# Patient Record
Sex: Male | Born: 1980 | Race: Black or African American | Hispanic: No | Marital: Single | Smoking: Never smoker
Health system: Southern US, Community
[De-identification: ages and names within clinical notes are randomized; demographics above are authoritative.]

---

## 2009-03-31 ENCOUNTER — Encounter: Admission: RE | Admit: 2009-03-31 | Discharge: 2009-03-31 | Payer: Self-pay | Admitting: Infectious Diseases

## 2012-11-01 ENCOUNTER — Emergency Department (HOSPITAL_COMMUNITY)
Admission: EM | Admit: 2012-11-01 | Discharge: 2012-11-01 | Disposition: A | Discharge status: Home / Self Care | Payer: No Typology Code available for payment source | Attending: Emergency Medicine | Admitting: Emergency Medicine

## 2012-11-01 ENCOUNTER — Encounter (HOSPITAL_COMMUNITY): Payer: Self-pay | Admitting: Emergency Medicine

## 2012-11-01 DIAGNOSIS — S139XXA Sprain of joints and ligaments of unspecified parts of neck, initial encounter: Secondary | ICD-10-CM | POA: Insufficient documentation

## 2012-11-01 DIAGNOSIS — Y9389 Activity, other specified: Secondary | ICD-10-CM | POA: Insufficient documentation

## 2012-11-01 DIAGNOSIS — S161XXA Strain of muscle, fascia and tendon at neck level, initial encounter: Secondary | ICD-10-CM

## 2012-11-01 DIAGNOSIS — Y92410 Unspecified street and highway as the place of occurrence of the external cause: Secondary | ICD-10-CM | POA: Insufficient documentation

## 2012-11-01 MED ORDER — IBUPROFEN 600 MG PO TABS: 600.0000 mg | ORAL_TABLET | Freq: Four times a day (QID) | ORAL | Status: DC | PRN

## 2012-11-01 MED ORDER — OXYCODONE-ACETAMINOPHEN 5-325 MG PO TABS: 1.0000 | ORAL_TABLET | Freq: Four times a day (QID) | ORAL | Status: DC | PRN

## 2012-11-01 MED ORDER — DIAZEPAM 5 MG PO TABS: 5.0000 mg | ORAL_TABLET | Freq: Four times a day (QID) | ORAL | Status: DC | PRN

## 2012-11-01 MED ORDER — IBUPROFEN 200 MG PO TABS
600.0000 mg | ORAL_TABLET | Freq: Once | ORAL | Status: AC
Start: 2012-11-01 — End: 2012-11-01
  Administered 2012-11-01: 600 mg via ORAL
  Filled 2012-11-01 (×2): qty 1

## 2012-11-01 NOTE — ED Notes (Signed)
MD at bedside; cleared from [facility].
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
## 2013-07-28 ENCOUNTER — Encounter (HOSPITAL_COMMUNITY): Payer: Self-pay | Admitting: Emergency Medicine

## 2013-07-28 DIAGNOSIS — J36 Peritonsillar abscess: Secondary | ICD-10-CM | POA: Insufficient documentation

## 2013-07-28 DIAGNOSIS — R1310 Dysphagia, unspecified: Secondary | ICD-10-CM | POA: Insufficient documentation

## 2013-07-28 DIAGNOSIS — Z79899 Other long term (current) drug therapy: Secondary | ICD-10-CM | POA: Insufficient documentation

## 2013-07-28 DIAGNOSIS — Z792 Long term (current) use of antibiotics: Secondary | ICD-10-CM | POA: Insufficient documentation

## 2013-07-28 LAB — RAPID STREP SCREEN (MED CTR MEBANE ONLY): Streptococcus, Group A Screen (Direct): NEGATIVE

## 2013-07-28 NOTE — ED Notes (Signed)
Pt. reports right sore throat with swelling / hard to swallow onset Sunday , seen at school clinic prescribed with Z-pack antibiotic. Denies fever . Airway intact / respirations unlabored .

## 2013-07-29 ENCOUNTER — Emergency Department (HOSPITAL_COMMUNITY): Payer: BC Managed Care – PPO

## 2013-07-29 ENCOUNTER — Emergency Department (HOSPITAL_COMMUNITY)
Admission: EM | Admit: 2013-07-29 | Discharge: 2013-07-29 | Disposition: A | Discharge status: Home / Self Care | Payer: BC Managed Care – PPO | Attending: Emergency Medicine | Admitting: Emergency Medicine

## 2013-07-29 ENCOUNTER — Encounter (HOSPITAL_COMMUNITY): Payer: Self-pay | Admitting: Radiology

## 2013-07-29 DIAGNOSIS — R1310 Dysphagia, unspecified: Secondary | ICD-10-CM

## 2013-07-29 DIAGNOSIS — J36 Peritonsillar abscess: Secondary | ICD-10-CM

## 2013-07-29 LAB — CBC WITH DIFFERENTIAL/PLATELET
Basophils Absolute: 0 10*3/uL (ref 0.0–0.1)
Basophils Relative: 0 % (ref 0–1)
Eosinophils Absolute: 0.1 10*3/uL (ref 0.0–0.7)
Eosinophils Relative: 1 % (ref 0–5)
HCT: 38.6 % — ABNORMAL LOW (ref 39.0–52.0)
Hemoglobin: 13.8 g/dL (ref 13.0–17.0)
LYMPHS ABS: 1.6 10*3/uL (ref 0.7–4.0)
LYMPHS PCT: 16 % (ref 12–46)
MCH: 29.1 pg (ref 26.0–34.0)
MCHC: 35.8 g/dL (ref 30.0–36.0)
MCV: 81.3 fL (ref 78.0–100.0)
MONO ABS: 0.9 10*3/uL (ref 0.1–1.0)
MONOS PCT: 9 % (ref 3–12)
Neutro Abs: 7.3 10*3/uL (ref 1.7–7.7)
Neutrophils Relative %: 74 % (ref 43–77)
Platelets: 168 10*3/uL (ref 150–400)
RBC: 4.75 MIL/uL (ref 4.22–5.81)
RDW: 12.8 % (ref 11.5–15.5)
WBC: 9.7 10*3/uL (ref 4.0–10.5)

## 2013-07-29 LAB — I-STAT CHEM 8, ED
BUN: 4 mg/dL — ABNORMAL LOW (ref 6–23)
CHLORIDE: 98 meq/L (ref 96–112)
CREATININE: 1 mg/dL (ref 0.50–1.35)
Calcium, Ion: 1.24 mmol/L — ABNORMAL HIGH (ref 1.12–1.23)
GLUCOSE: 102 mg/dL — AB (ref 70–99)
HCT: 42 % (ref 39.0–52.0)
Hemoglobin: 14.3 g/dL (ref 13.0–17.0)
POTASSIUM: 4.7 meq/L (ref 3.7–5.3)
SODIUM: 139 meq/L (ref 137–147)
TCO2: 28 mmol/L (ref 0–100)

## 2013-07-29 LAB — CULTURE, GROUP A STREP

## 2013-07-29 MED ORDER — AMOXICILLIN-POT CLAVULANATE 875-125 MG PO TABS: 1.0000 | ORAL_TABLET | Freq: Two times a day (BID) | ORAL | Status: AC

## 2013-07-29 MED ORDER — DEXAMETHASONE SODIUM PHOSPHATE 10 MG/ML IJ SOLN
10.0000 mg | Freq: Once | INTRAMUSCULAR | Status: AC
  Administered 2013-07-29: 10 mg via INTRAVENOUS
  Filled 2013-07-29: qty 1

## 2013-07-29 MED ORDER — HYDROCODONE-ACETAMINOPHEN 7.5-325 MG/15ML PO SOLN
10.0000 mL | Freq: Once | ORAL | Status: AC
  Administered 2013-07-29: 10 mL via ORAL
  Filled 2013-07-29: qty 15

## 2013-07-29 MED ORDER — SODIUM CHLORIDE 0.9 % IV SOLN
3.0000 g | Freq: Once | INTRAVENOUS | Status: AC
  Administered 2013-07-29: 3 g via INTRAVENOUS
  Filled 2013-07-29: qty 3

## 2013-07-29 MED ORDER — SODIUM CHLORIDE 0.9 % IV BOLUS (SEPSIS)
1000.0000 mL | Freq: Once | INTRAVENOUS | Status: AC
  Administered 2013-07-29: 1000 mL via INTRAVENOUS

## 2013-07-29 MED ORDER — HYDROCODONE-ACETAMINOPHEN 7.5-325 MG/15ML PO SOLN: 15.0000 mL | Freq: Three times a day (TID) | ORAL | Status: AC | PRN

## 2013-07-29 MED ORDER — IOHEXOL 300 MG/ML  SOLN
75.0000 mL | Freq: Once | INTRAMUSCULAR | Status: AC | PRN
  Administered 2013-07-29: 75 mL via INTRAVENOUS

## 2013-07-29 NOTE — Discharge Instructions (Signed)
Peritonsillar Abscess °A peritonsillar abscess is a collection of pus located in the back of the throat behind the tonsils. It usually occurs when a streptococcal infection of the throat or tonsils spreads into the space around the tonsils. They are almost always caused by the streptococcal germ (bacteria). The treatment of a peritonsillar abscess is most often drainage accomplished by putting a needle into the abscess or cutting (incising) and draining the abscess. This is most often followed with a course of antibiotics. °HOME CARE INSTRUCTIONS °· If your abscess was drained by your caregiver today, rinse your throat (gargle) with warm salt water four times per day or as needed for comfort. Do not swallow this mixture. Mix 1 teaspoon of salt in 8 ounces of warm water for gargling. °· Rest in bed as needed. Resume activities as able. °· Apply cold to your neck for pain relief. Fill a plastic bag with ice and wrap it in a towel. Hold the ice on your neck for 20 minutes 4 times per day. °· Eat a soft or liquid diet as tolerated while your throat remains sore. Popsicles and ice cream may be good early choices. Drinking plenty of cold fluids will probably be soothing and help take swelling down in between the warm gargles. °· Only take over-the-counter or prescription medicines for pain, discomfort, or fever as directed by your caregiver. Do not use aspirin unless directed by your physician. Aspirin slows down the clotting process. It can also cause bleeding from the drainage area if this was needled or incised today. °· If antibiotics were prescribed, take them as directed for the full course of the prescription. Even if you feel you are well, you need to take them. °SEEK MEDICAL CARE IF:  °· You have increased pain, swelling, redness, or drainage in your throat. °· You develop signs of infection such as dizziness, headache, lethargy, or generalized feelings of illness. °· You have difficulty breathing, swallowing or  eating. °· You show signs of becoming dehydrated (lightheadedness when standing, decreased urine output, a fast heart rate, or dry mouth and mucous membranes). °SEEK IMMEDIATE MEDICAL CARE IF:  °· You have a fever. °· You are coughing up or vomiting blood. °· You develop more severe throat pain uncontrolled with medicines or you start to drool. °· You develop difficulty breathing, talking, or find it easier to breathe while leaning forward. °Document Released: 01/21/2005 Document Revised: 04/15/2011 Document Reviewed: 09/04/2007 °ExitCare® Patient Information ©2015 [employer]. This information is not intended to replace advice given to you by your health care provider. Make sure you discuss any questions you have with your health care provider. ° °

## 2013-07-29 NOTE — ED Provider Notes (Signed)
CSN: [id number]     Arrival date & time 07/28/13  2248 History   First MD Initiated Contact with Patient 07/29/13 0201     Chief Complaint  Patient presents with  . Sore Throat    (Consider location/radiation/quality/duration/timing/severity/associated sxs/prior Treatment) HPI Comments: Patient is a 33 year old male with no significant past medical history who presents to the emergency department for right-sided sore throat x4 days. Patient states the symptoms have been gradually worsening since onset. Pain is worse with swallowing, the patient has been able to tolerate secretions. He states symptoms have been associated with anterior neck soreness as well as voice changes and difficulty speaking as a result of pain and swelling. Patient was seen at his school clinic 2 days ago and prescribed a Z-Pak. Patient states he has been able to tolerate this antibiotic, but has not been experiencing any relief. He is also been using Tussin DM for sore throat without improvement. Patient denies associated fever, drooling, shortness of breath, vomiting, neck stiffness, oral lesions, or oral bleeding.  Patient is a 33 y.o. male presenting with pharyngitis. The history is provided by the patient and a friend. No language interpreter was used.  Sore Throat Associated symptoms include a sore throat. Pertinent negatives include no fever or vomiting.    History reviewed. No pertinent past medical history. History reviewed. No pertinent past surgical history. No family history on file. History  Substance Use Topics  . Smoking status: Never Smoker   . Smokeless tobacco: Not on file  . Alcohol Use: No    Review of Systems  Constitutional: Positive for appetite change (secondary to sore throat.). Negative for fever.  HENT: Positive for sore throat and trouble swallowing. Negative for drooling and facial swelling.   Respiratory: Negative for shortness of breath.   Gastrointestinal: Negative for vomiting.   All other systems reviewed and are negative.    Allergies  Review of patient's allergies indicates no known allergies.  Home Medications   Prior to Admission medications   Medication Sig Start Date End Date Taking? Authorizing Koben Daman  azithromycin (ZITHROMAX) 250 MG tablet Take 250 mg by mouth daily. 07/27/13 07/31/13 Yes Historical Natina Wiginton, MD  dextromethorphan-guaiFENesin (TUSSIN DM) 10-100 MG/5ML liquid Take 10 mLs by mouth 2 (two) times daily.   Yes Historical Christopher Hink, MD  ibuprofen (ADVIL,MOTRIN) 200 MG tablet Take 200 mg by mouth every 6 (six) hours as needed.   Yes Historical Jonique Kulig, MD  Multiple Vitamins-Minerals (AIRBORNE PO) Take 1 tablet by mouth daily.   Yes Historical Tynslee Bowlds, MD   BP 123/72  Pulse 81  Temp(Src) 99.1 F (37.3 C) (Oral)  Resp 18  Wt 126 lb 3.2 oz (57.244 kg)  SpO2 99%  Physical Exam  Nursing note and vitals reviewed. Constitutional: He is oriented to person, place, and time. He appears well-developed and well-nourished. No distress.  Nontoxic/nonseptic appearing  HENT:  Head: Normocephalic and atraumatic.  Right Ear: No mastoid tenderness.  Left Ear: No mastoid tenderness.  Mouth/Throat: Mucous membranes are normal. No oral lesions. There is trismus (mild) in the jaw. Uvula swelling present. Posterior oropharyngeal edema and posterior oropharyngeal erythema present. No oropharyngeal exudate.  L tonsil 3+ and R tonsil 4+ without exudates. Difficult to assess uvula deviation secondary to tonsillar edema. +posterior oropharyngeal erythema. Patient tolerating secretions, but causes significant discomfort. Voice muffled.  Eyes: Conjunctivae and EOM are normal. No scleral icterus.  Neck: Normal range of motion.  No stridor No nuchal rigidity or meningismus.  Cardiovascular: Normal rate, regular rhythm  and normal heart sounds.   Pulmonary/Chest: Effort normal. No stridor. No respiratory distress. He has no wheezes. He has no rales.  No tachypnea,  dyspnea, or accessory muscle use. No tripoding.  Musculoskeletal: Normal range of motion.  Lymphadenopathy:    He has cervical adenopathy.       Right cervical: Superficial cervical adenopathy present.       Left cervical: Superficial cervical adenopathy present.  Neurological: He is alert and oriented to person, place, and time.  GCS 15. Patient moves extremities without ataxia.  Skin: Skin is warm and dry. No rash noted. He is not diaphoretic. No erythema. No pallor.  Psychiatric: He has a normal mood and affect. His behavior is normal.    ED Course  Procedures (including critical care time) Labs Review Labs Reviewed  CBC WITH DIFFERENTIAL - Abnormal; Notable for the following:    HCT 38.6 (*)    All other components within normal limits  I-STAT CHEM 8, ED - Abnormal; Notable for the following:    BUN 4 (*)    Glucose, Bld 102 (*)    Calcium, Ion 1.24 (*)    All other components within normal limits  RAPID STREP SCREEN  CULTURE, GROUP A STREP    Imaging Review Ct Soft Tissue Neck W Contrast  07/29/2013   CLINICAL DATA:  SORE THROAT  EXAM: CT NECK WITH CONTRAST  TECHNIQUE: Multidetector CT imaging of the neck was performed using the standard protocol following the bolus administration of intravenous contrast.  CONTRAST:  75mL OMNIPAQUE IOHEXOL 300 MG/ML  SOLN  COMPARISON:  None.  FINDINGS: Visualized portions of the brain and posterior fossa are unremarkable.  Moderate mucoperiosteal thickening present within the partially visualized right maxillary sinus. Air-fluid levels are present within the left maxillary and sphenoid sinuses. No mastoid effusion.  The salivary glands including the parotid glands and submandibular glands are within normal limits.  Oral cavity within normal limits without loculated fluid collection or mass lesion.  An ill-defined hypodense collection measuring approximately 1.1 x 1.8 x 2.1 cm seen within the right palatine tonsil (series 3, image 27). Central  calcification within this collection likely represents a tonsillith. Finding is compatible with tonsillitis with intra tonsillar abscess. There is mild deviation of the uvula to the left. Heterogeneous fluid density seen about the uvula and layering posteriorly within the hypopharynx (series 3, image 48). Tiny retropharyngeal effusion noted without retropharyngeal abscess.  Mildly prominent right level 2 lymph node measuring 1.1 cm in short axis noted, likely reactive in nature.  Epiglottis is normal.  Vallecula is clear.  True vocal cords are symmetric and normal in appearance. Subglottic airway is clear.  Thyroid gland is unremarkable.  Visualized portions of the superior mediastinum are within normal limits.  The visualized lung apices are clear.  Normal intravascular enhancement seen throughout the neck.  No acute osseous abnormality. Mild reversal of the normal cervical lordosis noted. No worrisome lytic or blastic osseous lesions.  IMPRESSION: 1. Loculated hypodense collection within the right palatine tonsil, consistent with intra tonsillar abscess. 2. Heterogeneous fluid density about the uvula and layering within the posterior hypopharynx, most compatible with retained secretions/mucus. No retropharyngeal abscess. 3. Mildly prominent 1.1 cm right level 2 lymph node, likely reactive in nature.   Electronically Signed   By: Rise MuBenjamin  McClintock M.D.   On: 07/29/2013 04:46     EKG Interpretation None      MDM   Final diagnoses:  Abscess, intratonsillar  Dysphagia    0245 -  33 year old male presents to the emergency department for right-sided sore throat x4 days. Symptoms associated with muffled voice and dysphasia. Patient denies fever and drooling, though swallowing has been very painful. Patient denies shortness of breath. Symptoms not resolved by azithromycin started 2 days ago after being seen at school clinic. Physical exam as above. Patient protecting airway. Will w/u with CT soft tissue  neck, CBC, and BMP.  0515 - CT reviewed which shows R intratonsillar abscess with mild uvula deviation. No retropharyngeal abscess. IV Unasyn ordered. Will consult ENT to discuss drainage.  [id number] - Spoke with Dr. Jearld FentonByers of ENT who recommends drainage in office. Requests patient call office at 8AM to schedule time for drainage today. Will plan to d/c with referral upon completion of abx. Will also prescribe Hycet for pain and Augmentin to continue unless counseled to d/c by Dr. Jearld FentonByers.   Filed Vitals:   07/29/13 0245 07/29/13 0300 07/29/13 0515 07/29/13 0530  BP:   106/65 110/69  Pulse: 85 81 79 75  Temp:      TempSrc:      Resp:   19 19  Weight:      SpO2: 98% 99% 95% 94%      Antony MaduraKelly Humes, PA-C 07/29/13 [phone]

## 2013-07-30 ENCOUNTER — Telehealth (HOSPITAL_BASED_OUTPATIENT_CLINIC_OR_DEPARTMENT_OTHER): Payer: Self-pay | Admitting: Emergency Medicine

## 2013-07-30 NOTE — Telephone Encounter (Signed)
Post ED Visit - Positive Culture Follow-up  Culture report reviewed by antimicrobial stewardship pharmacist: []  Wes Dulaney, Pharm.D., BCPS []  Celedonio MiyamotoJeremy Frens, Pharm.D., BCPS []  Georgina PillionElizabeth Martin, Pharm.D., BCPS [x]  SunbrookMinh Pham, [address] Rainbow BoulevardPharm.D., BCPS, AAHIVP []  Estella HuskMichelle Turner, Pharm.D., BCPS, AAHIVP []  Harvie JuniorNathan Cope, Pharm.D.  Positive Group A strep culture Treated with Augmentin and Azithromycin, organism sensitive to the same and no further patient follow-up is required at this time.  Jiles HaroldGammons, Shannon Chaney 07/30/2013, 12:35 PM

## 2013-08-01 NOTE — ED Provider Notes (Signed)
Medical screening examination/treatment/procedure(s) were performed by non-physician practitioner and as supervising physician I was immediately available for consultation/collaboration.   Merrie RoofJohn David Wofford III, MD 08/01/13 Rickey [id number]

## 2014-06-22 ENCOUNTER — Emergency Department (HOSPITAL_COMMUNITY): Payer: BLUE CROSS/BLUE SHIELD

## 2014-06-22 ENCOUNTER — Emergency Department (HOSPITAL_COMMUNITY)
Admission: EM | Admit: 2014-06-22 | Discharge: 2014-06-22 | Disposition: A | Discharge status: Home / Self Care | Payer: BLUE CROSS/BLUE SHIELD | Attending: Emergency Medicine | Admitting: Emergency Medicine

## 2014-06-22 ENCOUNTER — Encounter (HOSPITAL_COMMUNITY): Payer: Self-pay | Admitting: Emergency Medicine

## 2014-06-22 DIAGNOSIS — Z79899 Other long term (current) drug therapy: Secondary | ICD-10-CM | POA: Diagnosis not present

## 2014-06-22 DIAGNOSIS — M549 Dorsalgia, unspecified: Secondary | ICD-10-CM | POA: Diagnosis not present

## 2014-06-22 DIAGNOSIS — R079 Chest pain, unspecified: Secondary | ICD-10-CM

## 2014-06-22 DIAGNOSIS — R091 Pleurisy: Secondary | ICD-10-CM | POA: Diagnosis not present

## 2014-06-22 LAB — BASIC METABOLIC PANEL
Anion gap: 7 (ref 5–15)
BUN: 8 mg/dL (ref 6–20)
CALCIUM: 9.1 mg/dL (ref 8.9–10.3)
CO2: 27 mmol/L (ref 22–32)
CREATININE: 1.05 mg/dL (ref 0.61–1.24)
Chloride: 103 mmol/L (ref 101–111)
GFR calc Af Amer: >60 mL/min (ref >60)
GFR calc non Af Amer: >60 mL/min (ref >60)
Glucose, Bld: 110 mg/dL — ABNORMAL HIGH (ref 65–99)
POTASSIUM: 4.2 mmol/L (ref 3.5–5.1)
Sodium: 137 mmol/L (ref 135–145)

## 2014-06-22 LAB — CBC
HCT: 39.1 % (ref 39.0–52.0)
Hemoglobin: 13.9 g/dL (ref 13.0–17.0)
MCH: 28.8 pg (ref 26.0–34.0)
MCHC: 35.5 g/dL (ref 30.0–36.0)
MCV: 81.1 fL (ref 78.0–100.0)
PLATELETS: 159 10*3/uL (ref 150–400)
RBC: 4.82 MIL/uL (ref 4.22–5.81)
RDW: 12.8 % (ref 11.5–15.5)
WBC: 6.4 10*3/uL (ref 4.0–10.5)

## 2014-06-22 LAB — I-STAT TROPONIN, ED: TROPONIN I, POC: 0 ng/mL (ref 0.00–0.08)

## 2014-06-22 LAB — BRAIN NATRIURETIC PEPTIDE: B NATRIURETIC PEPTIDE 5: 1.9 pg/mL (ref 0.0–100.0)

## 2014-06-22 MED ORDER — TRAMADOL HCL 50 MG PO TABS: 50.0000 mg | ORAL_TABLET | Freq: Four times a day (QID) | ORAL | Status: AC | PRN

## 2014-06-22 MED ORDER — DEXAMETHASONE 4 MG PO TABS
12.0000 mg | ORAL_TABLET | Freq: Once | ORAL | Status: AC
  Administered 2014-06-22: 12 mg via ORAL
  Filled 2014-06-22: qty 3

## 2014-06-22 MED ORDER — KETOROLAC TROMETHAMINE 30 MG/ML IJ SOLN
30.0000 mg | Freq: Once | INTRAMUSCULAR | Status: AC
  Administered 2014-06-22: 30 mg via INTRAVENOUS
  Filled 2014-06-22: qty 1

## 2014-06-22 NOTE — ED Notes (Signed)
Pt. reports central chest pain with mild SOB onset yesterday , denies emesis or diaphoresis .

## 2014-06-22 NOTE — ED Provider Notes (Signed)
CSN: [id number]     Arrival date & time 06/22/14  0051 History  This chart was scribed for Dione Boozeavid Ramonia Mcclaran, MD by Abel PrestoKara Demonbreun, ED Scribe. This patient was seen in room D30C/D30C and the patient's care was started at 1:42 AM.    No chief complaint on file.    The history is provided by the patient. No language interpreter was used.   HPI Comments: Echo Peterson LombardBoamah Ta is a 34 y.o. male who presents to the Emergency Department complaining of 8/10 mid chest pain with onset 2 days ago. He states he was sitting at onset. Pt notes pain radiates to back. Pt states bending down, drinking, and laughing aggravates the pain. He denies any alleviating factors. Pt took Advil with no relief. Pt is not a smoker. He denies EtOH and drug use. He denies recent prolonged travel and cardiac FHx. Pt denies cough, SOB, nausea, and diaphoresis.  Pt does not have a PCP.  History reviewed. No pertinent past medical history. History reviewed. No pertinent past surgical history. No family history on file. History  Substance Use Topics  . Smoking status: Never Smoker   . Smokeless tobacco: Not on file  . Alcohol Use: No    Review of Systems  Constitutional: Negative for diaphoresis.  Respiratory: Negative for cough and shortness of breath.   Cardiovascular: Positive for chest pain.  Gastrointestinal: Negative for nausea.  Musculoskeletal: Positive for back pain.  All other systems reviewed and are negative.     Allergies  Review of patient's allergies indicates no known allergies.  Home Medications   Prior to Admission medications   Medication Sig Start Date End Date Taking? Authorizing Provider  amoxicillin-clavulanate (AUGMENTIN) 875-125 MG per tablet Take 1 tablet by mouth every 12 (twelve) hours. 07/29/13   Antony MaduraKelly Humes, PA-C  dextromethorphan-guaiFENesin (TUSSIN DM) 10-100 MG/5ML liquid Take 10 mLs by mouth 2 (two) times daily.    Historical Provider, MD  HYDROcodone-acetaminophen (HYCET) 7.5-325 mg/15  ml solution Take 15 mLs by mouth every 8 (eight) hours as needed for moderate pain. 07/29/13   Antony MaduraKelly Humes, PA-C  ibuprofen (ADVIL,MOTRIN) 200 MG tablet Take 200 mg by mouth every 6 (six) hours as needed.    Historical Provider, MD  Multiple Vitamins-Minerals (AIRBORNE PO) Take 1 tablet by mouth daily.    Historical Provider, MD   BP 125/82 mmHg  Pulse 84  Temp(Src) 98.3 F (36.8 C) (Oral)  Resp 16  SpO2 98% Physical Exam  Constitutional: He is oriented to person, place, and time. He appears well-developed and well-nourished.  HENT:  Head: Normocephalic.  Eyes: Conjunctivae are normal. Pupils are equal, round, and reactive to light.  Neck: Normal range of motion. Neck supple. No JVD present.  Cardiovascular: Normal rate, regular rhythm and normal heart sounds.   No murmur heard. Pulmonary/Chest: Effort normal and breath sounds normal. He has no wheezes. He has no rales. He exhibits no tenderness.  Abdominal: Soft. Bowel sounds are normal. He exhibits no distension and no mass. There is no tenderness.  Musculoskeletal: Normal range of motion. He exhibits no edema.  Lymphadenopathy:    He has no cervical adenopathy.  Neurological: He is alert and oriented to person, place, and time. No cranial nerve deficit. He exhibits normal muscle tone. Coordination normal.  Skin: Skin is warm and dry. No rash noted.  Psychiatric: He has a normal mood and affect. His behavior is normal. Judgment and thought content normal.  Nursing note and vitals reviewed.   ED Course  Procedures (including critical care time) DIAGNOSTIC STUDIES: Oxygen Saturation is 98% on room air, normal by my interpretation.    COORDINATION OF CARE: 1:46 AM Discussed treatment plan with patient at beside, the patient agrees with the plan and has no further questions at this time.   Labs Review Results for orders placed or performed during the hospital encounter of 06/22/14  CBC  Result Value Ref Range   WBC 6.4 4.0 -  10.5 K/uL   RBC 4.82 4.22 - 5.81 MIL/uL   Hemoglobin 13.9 13.0 - 17.0 g/dL   HCT 16.139.1 09.639.0 - 04.552.0 %   MCV 81.1 78.0 - 100.0 fL   MCH 28.8 26.0 - 34.0 pg   MCHC 35.5 30.0 - 36.0 g/dL   RDW 40.912.8 81.111.5 - 91.415.5 %   Platelets 159 150 - 400 K/uL  Basic metabolic panel  Result Value Ref Range   Sodium 137 135 - 145 mmol/L   Potassium 4.2 3.5 - 5.1 mmol/L   Chloride 103 101 - 111 mmol/L   CO2 27 22 - 32 mmol/L   Glucose, Bld 110 (H) 65 - 99 mg/dL   BUN 8 6 - 20 mg/dL   Creatinine, Ser 7.821.05 0.61 - 1.24 mg/dL   Calcium 9.1 8.9 - 95.610.3 mg/dL   GFR calc non Af Amer >60 >60 mL/min   GFR calc Af Amer >60 >60 mL/min   Anion gap 7 5 - 15  BNP (order ONLY if patient complains of dyspnea/SOB AND you have documented it for THIS visit)  Result Value Ref Range   B Natriuretic Peptide 1.9 0.0 - 100.0 pg/mL  I-stat troponin, ED  ([facility])  Result Value Ref Range   Troponin i, poc 0.00 0.00 - 0.08 ng/mL   Comment 3           Dg Chest 2 View  06/22/2014   CLINICAL DATA:  Mid chest pain since yesterday.  EXAM: CHEST  2 VIEW  COMPARISON:  03/31/2009  FINDINGS: The cardiomediastinal contours are normal. The lungs are clear. Pulmonary vasculature is normal. No consolidation, pleural effusion, or pneumothorax. No acute osseous abnormalities are seen.  IMPRESSION: No acute pulmonary process.   Electronically Signed   By: Rubye OaksMelanie  Ehinger M.D.   On: 06/22/2014 01:47     Imaging Review Dg Chest 2 View  06/22/2014   CLINICAL DATA:  Mid chest pain since yesterday.  EXAM: CHEST  2 VIEW  COMPARISON:  03/31/2009  FINDINGS: The cardiomediastinal contours are normal. The lungs are clear. Pulmonary vasculature is normal. No consolidation, pleural effusion, or pneumothorax. No acute osseous abnormalities are seen.  IMPRESSION: No acute pulmonary process.   Electronically Signed   By: Rubye OaksMelanie  Ehinger M.D.   On: 06/22/2014 01:47     EKG Interpretation   Date/Time:  Wednesday Jun 22 2014 01:04:32  EDT Ventricular Rate:  80 PR Interval:  130 QRS Duration: 86 QT Interval:  332 QTC Calculation: 382 R Axis:   9 Text Interpretation:  Normal sinus rhythm Normal ECG Confirmed by Lincoln Brighamees,  Liz [phone]([id number]) on 06/22/2014 1:08:02 AM      MDM   Final diagnoses:  Pleurisy    Pleuritic chest pain. He is sent for chest x-ray which is unremarkable. He is given a dose of ketorolac with no relief of pain. He is discharged with definitive diagnosis of pleurisy, probably of viral origin. He is advised to use ibuprofen for pain and is given a prescription for tramadol for pain not relieved  with ibuprofen.   I personally performed the services described in this documentation, which was scribed in my presence. The recorded information has been reviewed and is accurate.       Dione Booze, MD 06/22/14 [phone]

## 2014-06-22 NOTE — Discharge Instructions (Signed)
Take ibuprofen for less severe pain.  Pleurisy Pleurisy is an inflammation and swelling of the lining of the lungs (pleura). Because of this inflammation, it hurts to breathe. It can be aggravated by coughing, laughing, or deep breathing. Pleurisy is often caused by an underlying infection or disease.  HOME CARE INSTRUCTIONS  Monitor your pleurisy for any changes. The following actions may help to alleviate any discomfort you are experiencing:  Medicine may help with pain. Only take over-the-counter or prescription medicines for pain, discomfort, or fever as directed by your health care provider.  Only take antibiotic medicine as directed. Make sure to finish it even if you start to feel better. SEEK MEDICAL CARE IF:   Your pain is not controlled with medicine or is increasing.  You have an increase in pus-like (purulent) secretions brought up with coughing. SEEK IMMEDIATE MEDICAL CARE IF:   You have blue or dark lips, fingernails, or toenails.  You are coughing up blood.  You have increased difficulty breathing.  You have continuing pain unrelieved by medicine or pain lasting more than 1 week.  You have pain that radiates into your neck, arms, or jaw.  You develop increased shortness of breath or wheezing.  You develop a fever, rash, vomiting, fainting, or other serious symptoms. MAKE SURE YOU:  Understand these instructions.   Will watch your condition.   Will get help right away if you are not doing well or get worse.  Document Released: 01/21/2005 Document Revised: 09/23/2012 Document Reviewed: 07/05/2012 [facility] Patient Information 2015 [place], [place]. This information is not intended to replace advice given to you by your health care provider. Make sure you discuss any questions you have with your health care provider.  Tramadol tablets What is this medicine? TRAMADOL (TRA ma dole) is a pain reliever. It is used to treat moderate to severe pain in adults. This  medicine may be used for other purposes; ask your health care provider or pharmacist if you have questions. COMMON BRAND NAME(S): Ultram What should I tell my health care provider before I take this medicine? They need to know if you have any of these conditions: -brain tumor -depression -drug abuse or addiction -head injury -if you frequently drink alcohol containing drinks -kidney disease or trouble passing urine -liver disease -lung disease, asthma, or breathing problems -seizures or epilepsy -suicidal thoughts, plans, or attempt; a previous suicide attempt by you or a family member -an unusual or allergic reaction to tramadol, codeine, other medicines, foods, dyes, or preservatives -pregnant or trying to get pregnant -breast-feeding How should I use this medicine? Take this medicine by mouth with a full glass of water. Follow the directions on the prescription label. If the medicine upsets your stomach, take it with food or milk. Do not take more medicine than you are told to take. Talk to your pediatrician regarding the use of this medicine in children. Special care may be needed. Overdosage: If you think you have taken too much of this medicine contact a poison control center or emergency room at once. NOTE: This medicine is only for you. Do not share this medicine with others. What if I miss a dose? If you miss a dose, take it as soon as you can. If it is almost time for your next dose, take only that dose. Do not take double or extra doses. What may interact with this medicine? Do not take this medicine with any of the following medications: -MAOIs like Carbex, Eldepryl, Marplan, Nardil, and Parnate  This medicine may also interact with the following medications: -alcohol or medicines that contain alcohol -antihistamines -benzodiazepines -bupropion -carbamazepine or oxcarbazepine -clozapine -cyclobenzaprine -digoxin -furazolidone -linezolid -medicines for depression,  anxiety, or psychotic disturbances -medicines for migraine headache like almotriptan, eletriptan, frovatriptan, naratriptan, rizatriptan, sumatriptan, zolmitriptan -medicines for pain like pentazocine, buprenorphine, butorphanol, meperidine, nalbuphine, and propoxyphene -medicines for sleep -muscle relaxants -naltrexone -phenobarbital -phenothiazines like perphenazine, thioridazine, chlorpromazine, mesoridazine, fluphenazine, prochlorperazine, promazine, and trifluoperazine -procarbazine -warfarin This list may not describe all possible interactions. Give your health care provider a list of all the medicines, herbs, non-prescription drugs, or dietary supplements you use. Also tell them if you smoke, drink alcohol, or use illegal drugs. Some items may interact with your medicine. What should I watch for while using this medicine? Tell your doctor or health care professional if your pain does not go away, if it gets worse, or if you have new or a different type of pain. You may develop tolerance to the medicine. Tolerance means that you will need a higher dose of the medicine for pain relief. Tolerance is normal and is expected if you take this medicine for a long time. Do not suddenly stop taking your medicine because you may develop a severe reaction. Your body becomes used to the medicine. This does NOT mean you are addicted. Addiction is a behavior related to getting and using a drug for a non-medical reason. If you have pain, you have a medical reason to take pain medicine. Your doctor will tell you how much medicine to take. If your doctor wants you to stop the medicine, the dose will be slowly lowered over time to avoid any side effects. You may get drowsy or dizzy. Do not drive, use machinery, or do anything that needs mental alertness until you know how this medicine affects you. Do not stand or sit up quickly, especially if you are an older patient. This reduces the risk of dizzy or fainting  spells. Alcohol can increase or decrease the effects of this medicine. Avoid alcoholic drinks. You may have constipation. Try to have a bowel movement at least every 2 to 3 days. If you do not have a bowel movement for 3 days, call your doctor or health care professional. Your mouth may get dry. Chewing sugarless gum or sucking hard candy, and drinking plenty of water may help. Contact your doctor if the problem does not go away or is severe. What side effects may I notice from receiving this medicine? Side effects that you should report to your doctor or health care professional as soon as possible: -allergic reactions like skin rash, itching or hives, swelling of the face, lips, or tongue -breathing difficulties, wheezing -confusion -itching -light headedness or fainting spells -redness, blistering, peeling or loosening of the skin, including inside the mouth -seizures Side effects that usually do not require medical attention (report to your doctor or health care professional if they continue or are bothersome): -constipation -dizziness -drowsiness -headache -nausea, vomiting This list may not describe all possible side effects. Call your doctor for medical advice about side effects. You may report side effects to FDA at 1-800-FDA-1088. Where should I keep my medicine? Keep out of the reach of children. Store at room temperature between 15 and 30 degrees C (59 and 86 degrees F). Keep container tightly closed. Throw away any unused medicine after the expiration date. NOTE: This sheet is a summary. It may not cover all possible information. If you have questions about this medicine, talk to  your doctor, pharmacist, or health care provider.  2015, [employer]. (2009-10-04 11:55:44)

## 2015-06-15 IMAGING — CT CT NECK W/ CM
3 of 5 series · 12 of 33 positions shown, 14 images · IV contrast (Omni 300)
Comparison: None.

CLINICAL DATA: SORE THROAT

EXAM:
CT NECK WITH CONTRAST
TECHNIQUE: Multidetector CT imaging of the neck was performed using the
standard protocol following the bolus administration of intravenous
contrast.
CONTRAST:  75mL OMNIPAQUE IOHEXOL 300 MG/ML  SOLN

[Series 3: neck 2.0 i31s 3 · axial · 0.46mm/px · z∈[-236,-104]mm · 4 of 112 slices shown, 5 images]
[im 23/112  soft-tissue]
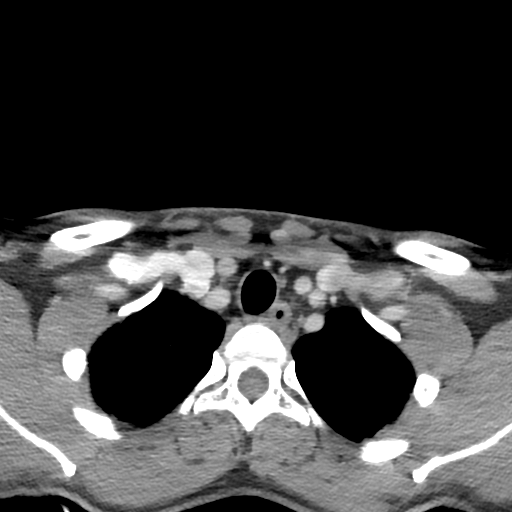
[im 23/112  bone]
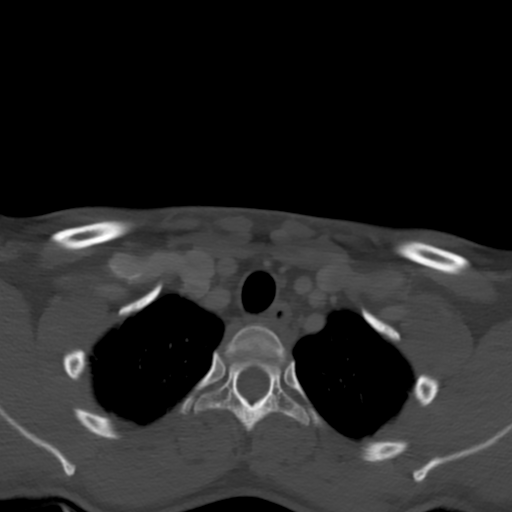
[im 45/112  bone]
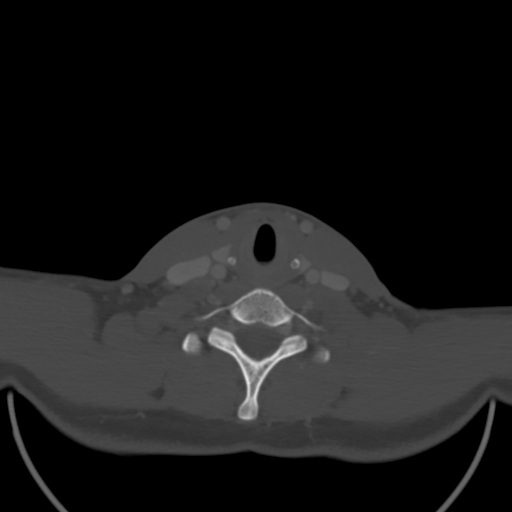
[im 67/112  bone]
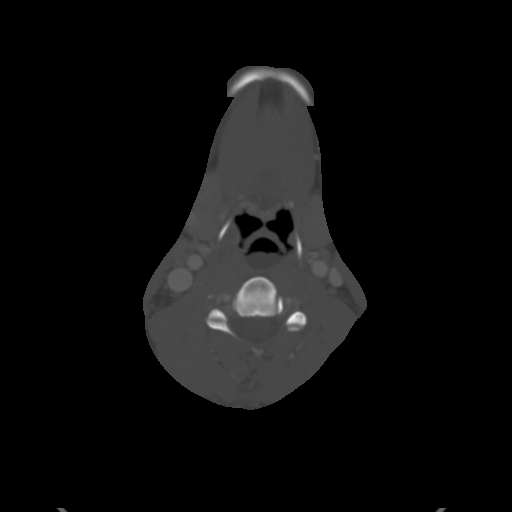
[im 89/112  bone]
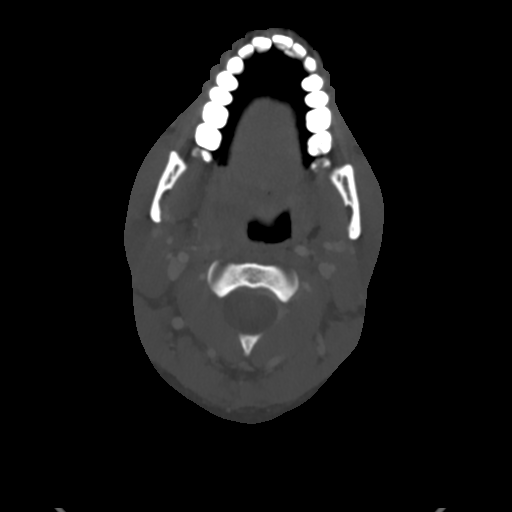

[Series 6: coronal st · coronal · 0.31mm/px · 3 of 106 slices shown]
[im 22/106  bone]
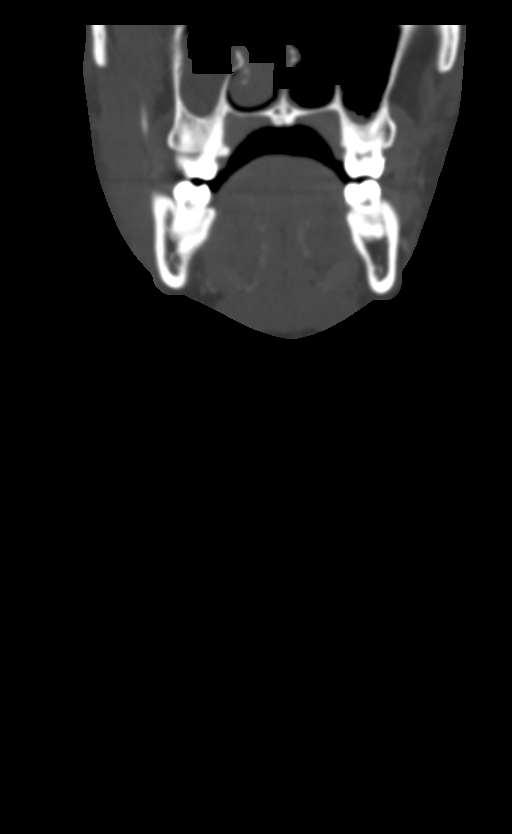
[im 43/106  bone]
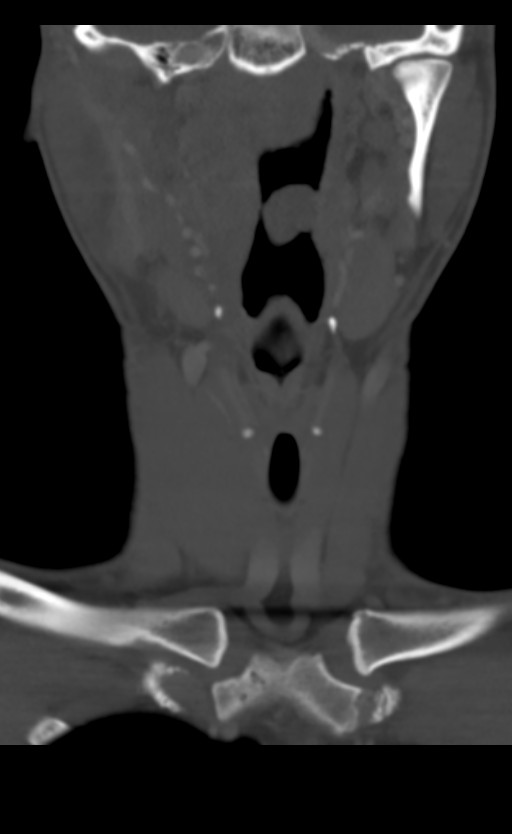
[im 64/106  bone]
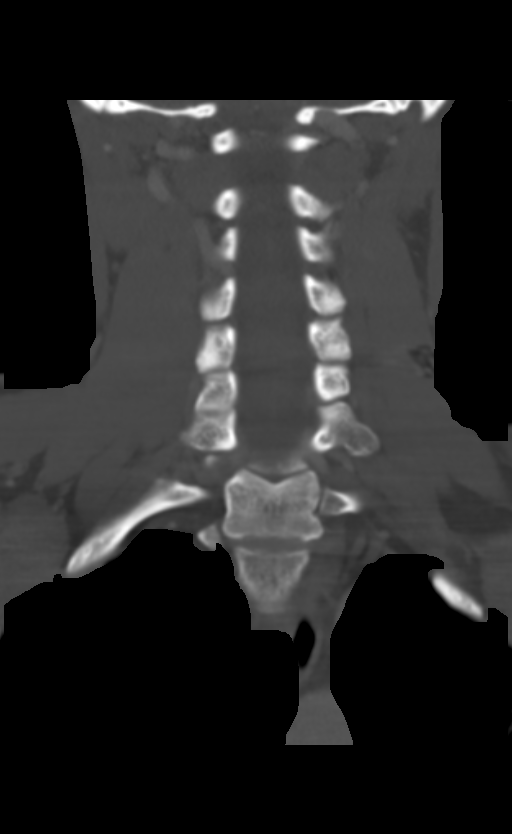

[Series 7: sagittal st · sagittal · 0.40mm/px · 5 of 101 slices shown, 6 images]
[im 34/101  bone]
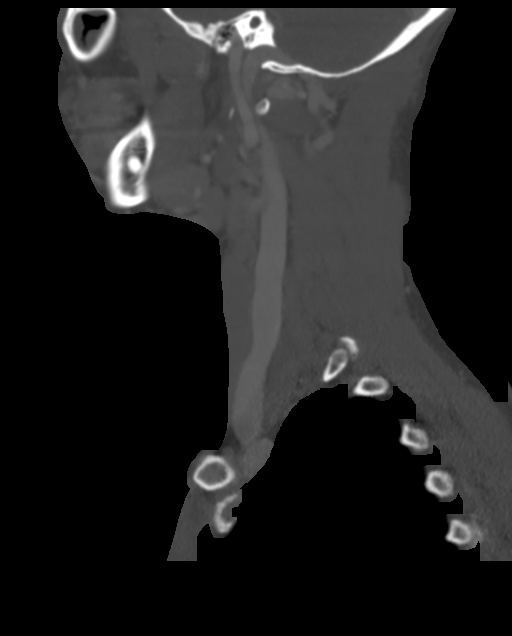
[im 42/101  bone]
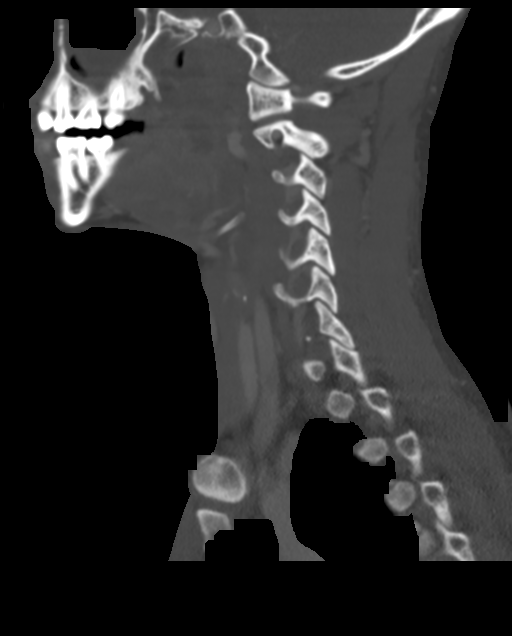
[im 51/101  soft-tissue]
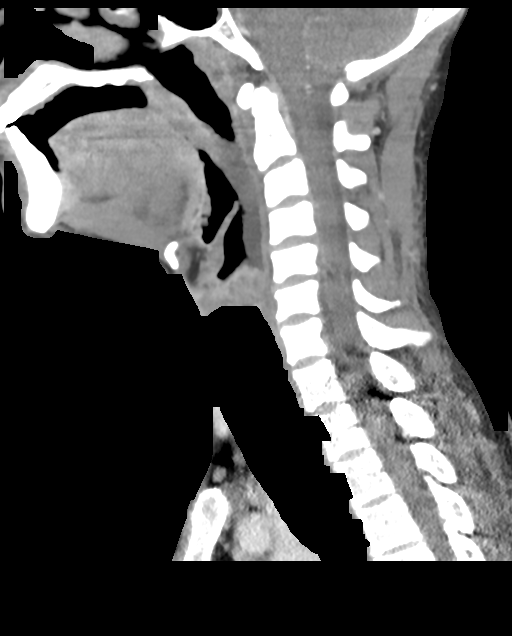
[im 51/101  bone]
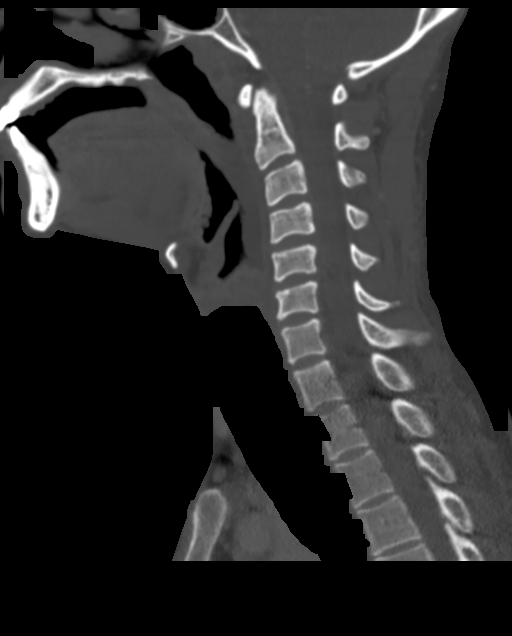
[im 59/101  bone]
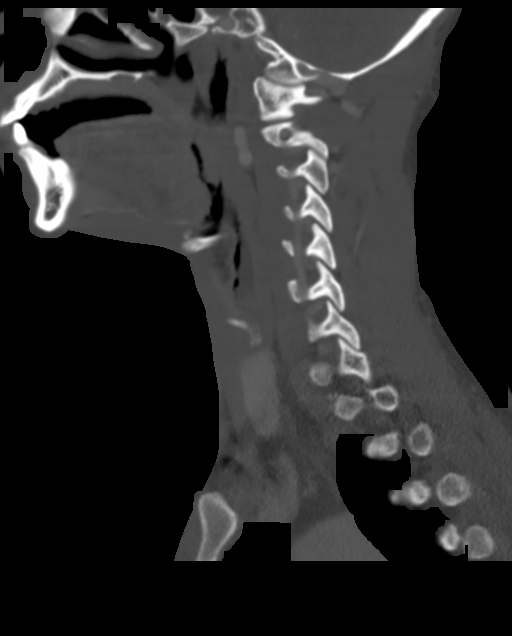
[im 67/101  bone]
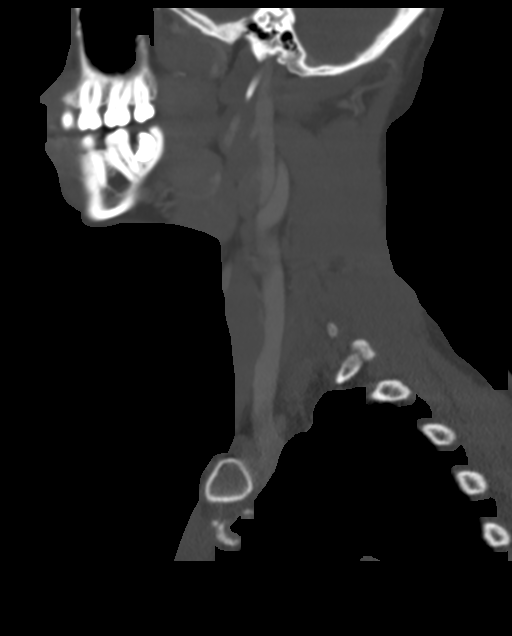

[12 of 33 positions shown; findings below may reference images not displayed]

FINDINGS: Visualized portions of the brain and posterior fossa are
unremarkable.

Moderate mucoperiosteal thickening present within the partially
visualized right maxillary sinus. Air-fluid levels are present
within the left maxillary and sphenoid sinuses. No mastoid effusion.

The salivary glands including the parotid glands and submandibular
glands are within normal limits.

Oral cavity within normal limits without loculated fluid collection
or mass lesion.

An ill-defined hypodense collection measuring approximately 1.1 x
1.8 x 2.1 cm seen within the right palatine tonsil (series 3, image
27). Central calcification within this collection likely represents
a tonsillith. Finding is compatible with tonsillitis with intra
tonsillar abscess. There is mild deviation of the uvula to the left.
Heterogeneous fluid density seen about the uvula and layering
posteriorly within the hypopharynx (series 3, image 48). Tiny
retropharyngeal effusion noted without retropharyngeal abscess.

Mildly prominent right level 2 lymph node measuring 1.1 cm in short
axis noted, likely reactive in nature.

Epiglottis is normal.  Vallecula is clear.

True vocal cords are symmetric and normal in appearance. Subglottic
airway is clear.

Thyroid gland is unremarkable.

Visualized portions of the superior mediastinum are within normal
limits.

The visualized lung apices are clear.

Normal intravascular enhancement seen throughout the neck.

No acute osseous abnormality. Mild reversal of the normal cervical
lordosis noted. No worrisome lytic or blastic osseous lesions.
IMPRESSION: 1. Loculated hypodense collection within the right palatine tonsil,
consistent with intra tonsillar abscess.
2. Heterogeneous fluid density about the uvula and layering within
the posterior hypopharynx, most compatible with retained
secretions/mucus. No retropharyngeal abscess.
3. Mildly prominent 1.1 cm right level 2 lymph node, likely reactive
in nature.

## 2016-05-08 IMAGING — CR DG CHEST 2V
2 series · 2 of 2 positions shown · non-contrast
Comparison: 03/31/2009

CLINICAL DATA: Mid chest pain since yesterday.

EXAM:
CHEST  2 VIEW

[chest pa]
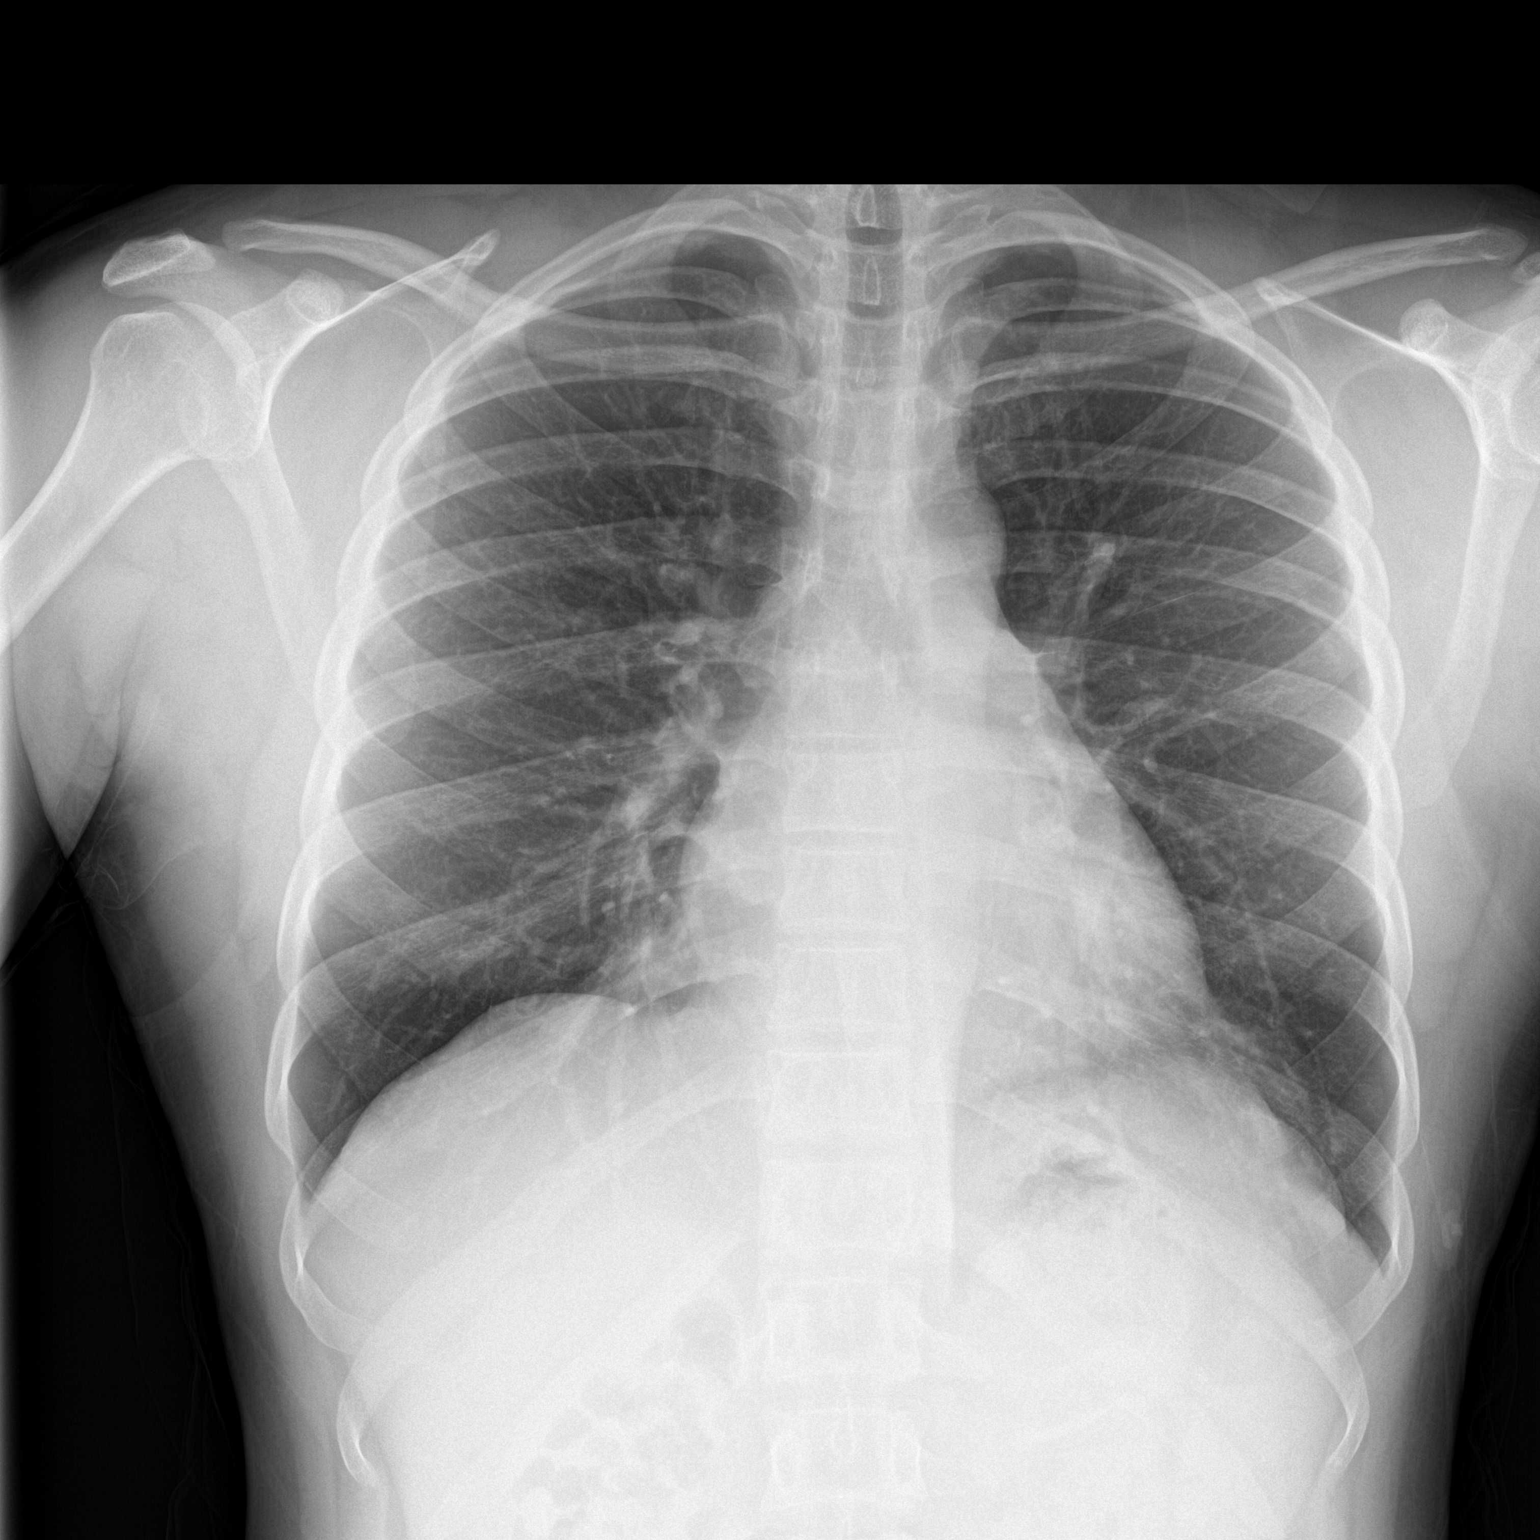

[chest lat]
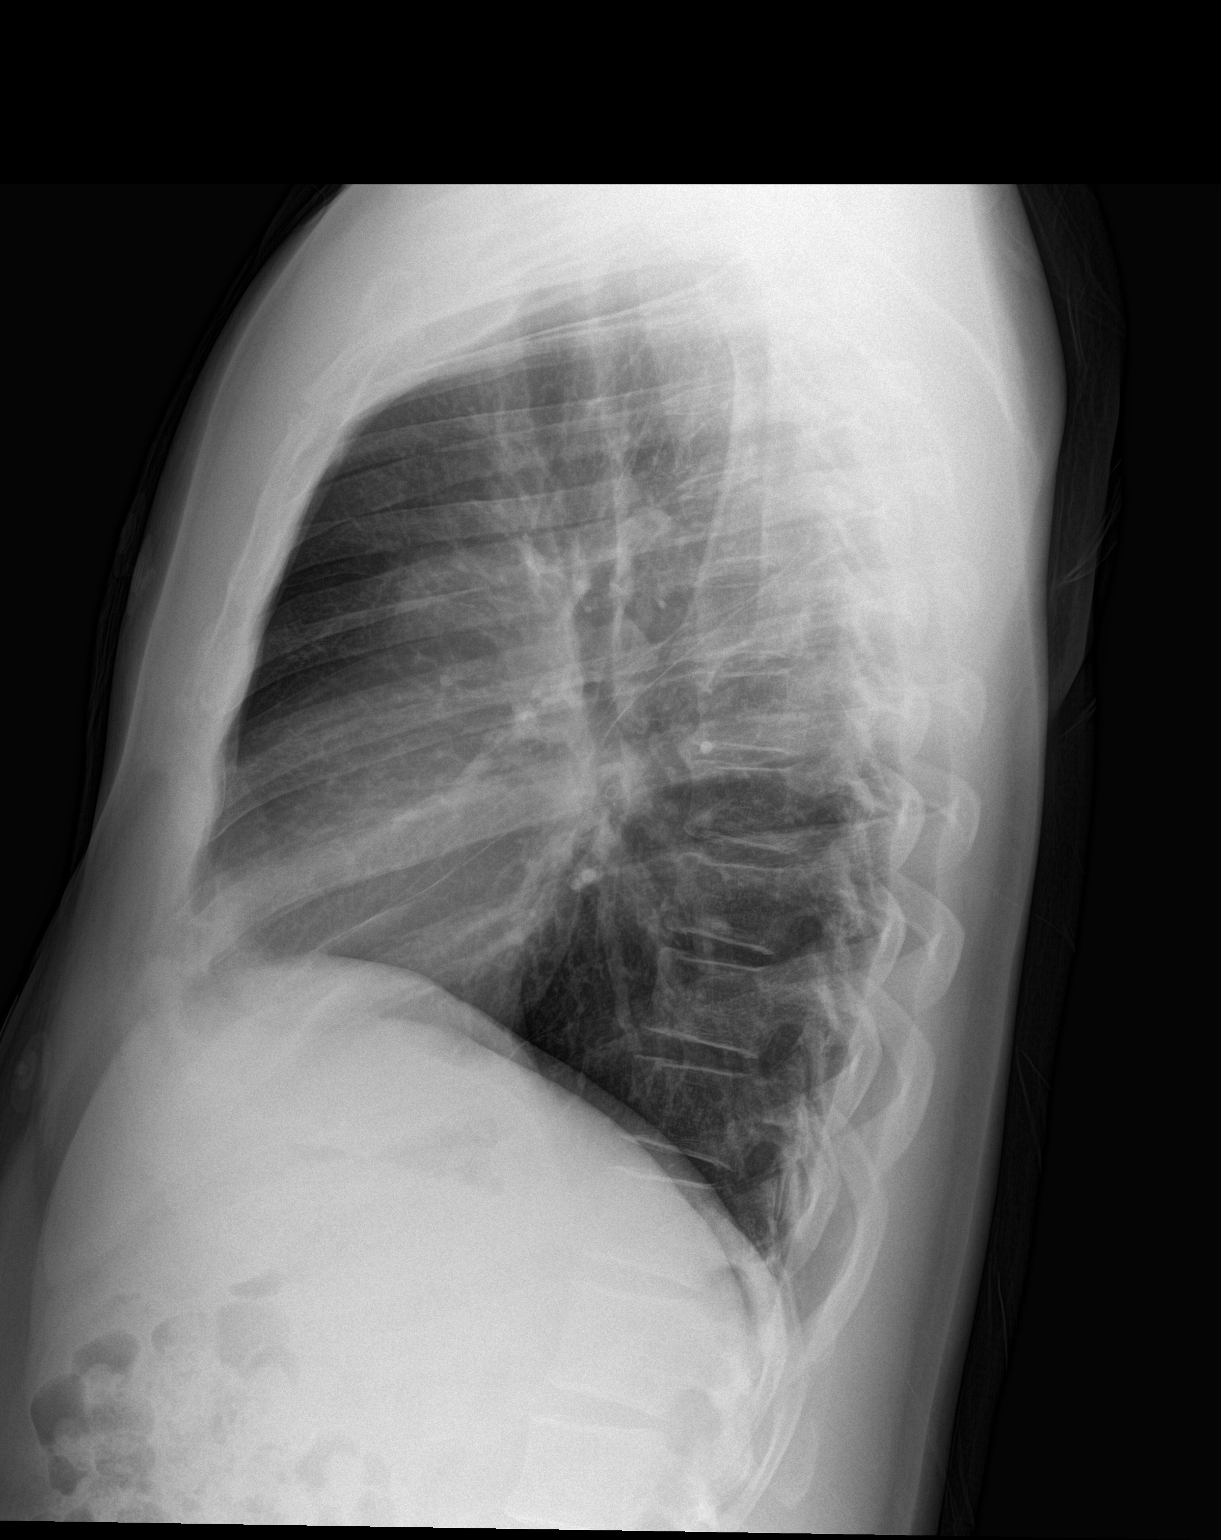

[2 of 2 positions shown; findings below may reference images not displayed]

FINDINGS: The cardiomediastinal contours are normal. The lungs are clear.
Pulmonary vasculature is normal. No consolidation, pleural effusion,
or pneumothorax. No acute osseous abnormalities are seen.
IMPRESSION: No acute pulmonary process.
